# Patient Record
Sex: Female | Born: 1980 | Hispanic: Yes | Marital: Married | Smoking: Never smoker
Health system: Southern US, Community
[De-identification: ages and names within clinical notes are randomized; demographics above are authoritative.]

## PROBLEM LIST (undated history)

## (undated) DIAGNOSIS — J45909 Unspecified asthma, uncomplicated: Secondary | ICD-10-CM

## (undated) HISTORY — PX: CHOLECYSTECTOMY: SHX55

## (undated) HISTORY — DX: Unspecified asthma, uncomplicated: J45.909

---

## 2006-01-20 ENCOUNTER — Ambulatory Visit: Payer: Self-pay | Admitting: Family

## 2006-06-06 ENCOUNTER — Emergency Department: Payer: Self-pay | Admitting: Emergency Medicine

## 2013-06-03 ENCOUNTER — Emergency Department: Payer: Self-pay | Admitting: Emergency Medicine

## 2013-06-03 LAB — BASIC METABOLIC PANEL
ANION GAP: 4 — AB (ref 7–16)
BUN: 9 mg/dL (ref 7–18)
CHLORIDE: 106 mmol/L (ref 98–107)
CO2: 29 mmol/L (ref 21–32)
Calcium, Total: 9.2 mg/dL (ref 8.5–10.1)
Creatinine: 0.6 mg/dL (ref 0.60–1.30)
EGFR (African American): >60
EGFR (Non-African Amer.): >60
Glucose: 95 mg/dL (ref 65–99)
Osmolality: 276 (ref 275–301)
Potassium: 4.1 mmol/L (ref 3.5–5.1)
SODIUM: 139 mmol/L (ref 136–145)

## 2013-06-03 LAB — CBC
HCT: 43.3 % (ref 35.0–47.0)
HGB: 14.3 g/dL (ref 12.0–16.0)
MCH: 28.4 pg (ref 26.0–34.0)
MCHC: 32.9 g/dL (ref 32.0–36.0)
MCV: 86 fL (ref 80–100)
Platelet: 184 10*3/uL (ref 150–440)
RBC: 5.02 10*6/uL (ref 3.80–5.20)
RDW: 13.5 % (ref 11.5–14.5)
WBC: 8.8 10*3/uL (ref 3.6–11.0)

## 2013-06-03 LAB — TROPONIN I: Troponin-I: <0.02 ng/mL

## 2014-03-03 ENCOUNTER — Ambulatory Visit: Payer: Self-pay | Admitting: Family Medicine

## 2021-02-26 ENCOUNTER — Emergency Department: Payer: Self-pay

## 2021-02-26 ENCOUNTER — Emergency Department
Admission: EM | Admit: 2021-02-26 | Discharge: 2021-02-26 | Disposition: A | Discharge status: Home / Self Care | Payer: Self-pay | Attending: Emergency Medicine | Admitting: Emergency Medicine

## 2021-02-26 ENCOUNTER — Other Ambulatory Visit: Payer: Self-pay

## 2021-02-26 ENCOUNTER — Encounter: Payer: Self-pay | Admitting: Emergency Medicine

## 2021-02-26 DIAGNOSIS — Z3A01 Less than 8 weeks gestation of pregnancy: Secondary | ICD-10-CM | POA: Insufficient documentation

## 2021-02-26 DIAGNOSIS — O209 Hemorrhage in early pregnancy, unspecified: Secondary | ICD-10-CM

## 2021-02-26 DIAGNOSIS — Z3491 Encounter for supervision of normal pregnancy, unspecified, first trimester: Secondary | ICD-10-CM

## 2021-02-26 DIAGNOSIS — O200 Threatened abortion: Secondary | ICD-10-CM | POA: Insufficient documentation

## 2021-02-26 DIAGNOSIS — R102 Pelvic and perineal pain: Secondary | ICD-10-CM | POA: Insufficient documentation

## 2021-02-26 LAB — CBC WITH DIFFERENTIAL/PLATELET
Abs Immature Granulocytes: 0.07 10*3/uL (ref 0.00–0.07)
Basophils Absolute: 0.1 10*3/uL (ref 0.0–0.1)
Basophils Relative: 1 %
Eosinophils Absolute: 0.8 10*3/uL — ABNORMAL HIGH (ref 0.0–0.5)
Eosinophils Relative: 6 %
HCT: 41.5 % (ref 36.0–46.0)
Hemoglobin: 14 g/dL (ref 12.0–15.0)
Immature Granulocytes: 1 %
Lymphocytes Relative: 18 %
Lymphs Abs: 2.2 10*3/uL (ref 0.7–4.0)
MCH: 28.2 pg (ref 26.0–34.0)
MCHC: 33.7 g/dL (ref 30.0–36.0)
MCV: 83.7 fL (ref 80.0–100.0)
Monocytes Absolute: 1 10*3/uL (ref 0.1–1.0)
Monocytes Relative: 8 %
Neutro Abs: 8.1 10*3/uL — ABNORMAL HIGH (ref 1.7–7.7)
Neutrophils Relative %: 66 %
Platelets: 189 10*3/uL (ref 150–400)
RBC: 4.96 MIL/uL (ref 3.87–5.11)
RDW: 13.5 % (ref 11.5–15.5)
WBC: 12.2 10*3/uL — ABNORMAL HIGH (ref 4.0–10.5)
nRBC: 0 % (ref 0.0–0.2)

## 2021-02-26 LAB — BASIC METABOLIC PANEL
Anion gap: 7 (ref 5–15)
BUN: 8 mg/dL (ref 6–20)
CO2: 23 mmol/L (ref 22–32)
Calcium: 9.2 mg/dL (ref 8.9–10.3)
Chloride: 105 mmol/L (ref 98–111)
Creatinine, Ser: 0.42 mg/dL — ABNORMAL LOW (ref 0.44–1.00)
GFR, Estimated: >60 mL/min (ref >60)
Glucose, Bld: 120 mg/dL — ABNORMAL HIGH (ref 70–99)
Potassium: 3.5 mmol/L (ref 3.5–5.1)
Sodium: 135 mmol/L (ref 135–145)

## 2021-02-26 LAB — HCG, QUANTITATIVE, PREGNANCY: hCG, Beta Chain, Quant, S: 2809 m[IU]/mL — ABNORMAL HIGH (ref <5)

## 2021-02-26 LAB — POC URINE PREG, ED: Preg Test, Ur: POSITIVE — AB

## 2021-02-26 NOTE — Discharge Instructions (Addendum)
Results for orders placed or performed during the hospital encounter of 02/26/21  CBC with Differential  Result Value Ref Range   WBC 12.2 (H) 4.0 - 10.5 K/uL   RBC 4.96 3.87 - 5.11 MIL/uL   Hemoglobin 14.0 12.0 - 15.0 g/dL   HCT 00.1 74.9 - 44.9 %   MCV 83.7 80.0 - 100.0 fL   MCH 28.2 26.0 - 34.0 pg   MCHC 33.7 30.0 - 36.0 g/dL   RDW 67.5 91.6 - 38.4 %   Platelets 189 150 - 400 K/uL   nRBC 0.0 0.0 - 0.2 %   Neutrophils Relative % 66 %   Neutro Abs 8.1 (H) 1.7 - 7.7 K/uL   Lymphocytes Relative 18 %   Lymphs Abs 2.2 0.7 - 4.0 K/uL   Monocytes Relative 8 %   Monocytes Absolute 1.0 0.1 - 1.0 K/uL   Eosinophils Relative 6 %   Eosinophils Absolute 0.8 (H) 0.0 - 0.5 K/uL   Basophils Relative 1 %   Basophils Absolute 0.1 0.0 - 0.1 K/uL   Immature Granulocytes 1 %   Abs Immature Granulocytes 0.07 0.00 - 0.07 K/uL  Basic metabolic panel  Result Value Ref Range   Sodium 135 135 - 145 mmol/L   Potassium 3.5 3.5 - 5.1 mmol/L   Chloride 105 98 - 111 mmol/L   CO2 23 22 - 32 mmol/L   Glucose, Bld 120 (H) 70 - 99 mg/dL   BUN 8 6 - 20 mg/dL   Creatinine, Ser 6.65 (L) 0.44 - 1.00 mg/dL   Calcium 9.2 8.9 - 99.3 mg/dL   GFR, Estimated >57 >01 mL/min   Anion gap 7 5 - 15  POC urine preg, ED  Result Value Ref Range   Preg Test, Ur POSITIVE (A) NEGATIVE   US OB LESS THAN 14 WEEKS WITH OB TRANSVAGINAL  Result Date: 02/26/2021 CLINICAL DATA:  Vaginal bleeding EXAM: OBSTETRIC <14 WK [place] AND TRANSVAGINAL OB US TECHNIQUE: Both transabdominal and transvaginal ultrasound examinations were performed for complete evaluation of the gestation as well as the maternal uterus, adnexal regions, and pelvic cul-de-sac. Transvaginal technique was performed to assess early pregnancy. COMPARISON:  None. FINDINGS: Intrauterine gestational sac: Single intrauterine gestational sac Yolk sac:  Not definitively visualized. Embryo:  Not visualized MSD: 17 mm   6 w   4 d Subchorionic hemorrhage:  Small subchorionic  hemorrhage Maternal uterus/adnexae: Ovaries are within normal limits. The right ovary measures 3 x 1.9 by 1.8 cm. The left ovary measures 3.3 x 1.8 x 1.8 cm. Small free fluid IMPRESSION: 1. Single slightly irregular intrauterine gestational sac with mean sac diameter of 17 mm and no embryo. Findings are suspicious but not yet definitive for failed pregnancy. Recommend follow-up US in 10-14 days for definitive diagnosis. This recommendation follows SRU consensus guidelines: Diagnostic Criteria for Nonviable Pregnancy Early in the First Trimester. Malva Limes Med 2013; 779:3903-00. 2. Small subchorionic hemorrhage Electronically Signed   By: Jasmine Pang M.D.   On: 02/26/2021 22:03

## 2021-02-26 NOTE — ED Triage Notes (Signed)
Pt here with c/o vaginal bleeding for 3 days now, lmp start date 02/24/21, states painful lower abd cramping, severe lower back pain, heavy bleeding with clots, denies known pregnancy test. Uses depo shot for birth control. NAD.

## 2021-02-26 NOTE — ED Provider Notes (Signed)
[facility] Emergency Department Provider Note  ____________________________________________  Time seen: Approximately 11:05 PM  I have reviewed the triage vital signs and the nursing notes.   HISTORY  Chief Complaint Vaginal Bleeding  Encounter completed with Spanish video interpreter at bedside  HPI Barbara Blevins is a 40 y.o. female with past history of asthma who comes the ED complaining of vaginal bleeding for the past 2 days associated with cramping pelvic pain radiating to bilateral hips.  Bleeding is intermittent, mild.  No dysuria or abnormal vaginal discharge.  No fever, no chest pain or shortness of breath.  Patient reports having a negative pregnancy test in October at her PCP prior to having a Depo-Provera injection for contraception.    History reviewed. No pertinent past medical history.   There are no problems to display for this patient.    History reviewed. No pertinent surgical history.   Prior to Admission medications   Not on File     Allergies Patient has no known allergies.   No family history on file.  Social History Social History   Tobacco Use   Smoking status: Never   Smokeless tobacco: Never  Substance Use Topics   Alcohol use: Not Currently   Drug use: Never    Review of Systems  Constitutional:   No fever or chills.  ENT:   No sore throat. No rhinorrhea. Cardiovascular:   No chest pain or syncope. Respiratory:   No dyspnea or cough. Gastrointestinal:   Positive pelvic pain as above.  Musculoskeletal:   Negative for focal pain or swelling All other systems reviewed and are negative except as documented above in ROS and HPI.  ____________________________________________   PHYSICAL EXAM:  VITAL SIGNS: ED Triage Vitals  Enc Vitals Group     BP 02/26/21 2217 137/76     Pulse Rate 02/26/21 2217 80     Resp 02/26/21 2217 18     Temp 02/26/21 1614 98.4 F (36.9 C)     Temp Source 02/26/21 1614  Oral     SpO2 02/26/21 2217 100 %     Weight 02/26/21 1615 175 lb (79.4 kg)     Height 02/26/21 1615 5' (1.524 m)     Head Circumference --      Peak Flow --      Pain Score 02/26/21 1615 10     Pain Loc --      Pain Edu? --      Excl. in GC? --     Vital signs reviewed, nursing assessments reviewed.   Constitutional:   Alert and oriented. Non-toxic appearance. Eyes:   Conjunctivae are normal. EOMI. PERRL. ENT      Head:   Normocephalic and atraumatic.      Nose:   Wearing a mask.      Mouth/Throat:   Wearing a mask.      Neck:   No meningismus. Full ROM. Hematological/Lymphatic/Immunilogical:   No cervical lymphadenopathy. Cardiovascular:   RRR. Symmetric bilateral radial and DP pulses.  No murmurs. Cap refill less than 2 seconds. Respiratory:   Normal respiratory effort without tachypnea/retractions. Breath sounds are clear and equal bilaterally. No wheezes/rales/rhonchi. Gastrointestinal:   Soft and nontender. Non distended. There is no CVA tenderness.  No rebound, rigidity, or guarding. Genitourinary:   deferred Musculoskeletal:   Normal range of motion in all extremities. No joint effusions.  No lower extremity tenderness.  No edema. Neurologic:   Normal speech and language.  Motor grossly intact. No  acute focal neurologic deficits are appreciated.  Skin:    Skin is warm, dry and intact. No rash noted.  No petechiae, purpura, or bullae.  ____________________________________________    LABS (pertinent positives/negatives) (all labs ordered are listed, but only abnormal results are displayed) Labs Reviewed  CBC WITH DIFFERENTIAL/PLATELET - Abnormal; Notable for the following components:      Result Value   WBC 12.2 (*)    Neutro Abs 8.1 (*)    Eosinophils Absolute 0.8 (*)    All other components within normal limits  BASIC METABOLIC PANEL - Abnormal; Notable for the following components:   Glucose, Bld 120 (*)    Creatinine, Ser 0.42 (*)    All other components  within normal limits  POC URINE PREG, ED - Abnormal; Notable for the following components:   Preg Test, Ur POSITIVE (*)    All other components within normal limits  HCG, QUANTITATIVE, PREGNANCY  POC URINE PREG, ED  ABO/RH   ____________________________________________   EKG    ____________________________________________    RADIOLOGY  US OB LESS THAN 14 WEEKS WITH OB TRANSVAGINAL  Result Date: 02/26/2021 CLINICAL DATA:  Vaginal bleeding EXAM: OBSTETRIC <14 WK [place] AND TRANSVAGINAL OB US TECHNIQUE: Both transabdominal and transvaginal ultrasound examinations were performed for complete evaluation of the gestation as well as the maternal uterus, adnexal regions, and pelvic cul-de-sac. Transvaginal technique was performed to assess early pregnancy. COMPARISON:  None. FINDINGS: Intrauterine gestational sac: Single intrauterine gestational sac Yolk sac:  Not definitively visualized. Embryo:  Not visualized MSD: 17 mm   6 w   4 d Subchorionic hemorrhage:  Small subchorionic hemorrhage Maternal uterus/adnexae: Ovaries are within normal limits. The right ovary measures 3 x 1.9 by 1.8 cm. The left ovary measures 3.3 x 1.8 x 1.8 cm. Small free fluid IMPRESSION: 1. Single slightly irregular intrauterine gestational sac with mean sac diameter of 17 mm and no embryo. Findings are suspicious but not yet definitive for failed pregnancy. Recommend follow-up US in 10-14 days for definitive diagnosis. This recommendation follows SRU consensus guidelines: Diagnostic Criteria for Nonviable Pregnancy Early in the First Trimester. Malva Limes Med 2013; 595:6387-56. 2. Small subchorionic hemorrhage Electronically Signed   By: Jasmine Pang M.D.   On: 02/26/2021 22:03    ____________________________________________   PROCEDURES Procedures  ____________________________________________  DIFFERENTIAL DIAGNOSIS   Threatened Miscarriage, ectopic pregnancy  CLINICAL IMPRESSION / ASSESSMENT AND PLAN / ED  COURSE  Medications ordered in the ED: Medications - No data to display  Pertinent labs & imaging results that were available during my care of the patient were reviewed by me and considered in my medical decision making (see chart for details).  Melrose Arbuthnot was evaluated in Emergency Department on 02/26/2021 for the symptoms described in the history of present illness. She was evaluated in the context of the global COVID-19 pandemic, which necessitated consideration that the patient might be at risk for infection with the SARS-CoV-2 virus that causes COVID-19. Institutional protocols and algorithms that pertain to the evaluation of patients at risk for COVID-19 are in a state of rapid change based on information released by regulatory bodies including the CDC and federal and state organizations. These policies and algorithms were followed during the patient's care in the ED.   Patient presents with vaginal bleeding, positive pregnancy test.  Prior records show blood type is O+.  Pelvic ultrasound shows intrauterine gestational sac without embryo, suggestive but not definitive for failed pregnancy.  Counseled patient on threatened miscarriage, need  to follow-up with OB in 3 days for reassessment.  Return precautions discussed.  Serum labs unremarkable.  Beta hCG pending.      ____________________________________________   FINAL CLINICAL IMPRESSION(S) / ED DIAGNOSES    Final diagnoses:  Threatened miscarriage  First trimester pregnancy     ED Discharge Orders     None       Portions of this note were generated with dragon dictation software. Dictation errors may occur despite best attempts at proofreading.    Sharman Cheek, MD 02/26/21 [id number]

## 2021-02-27 LAB — ABO/RH: ABO/RH(D): O POS

## 2022-12-22 ENCOUNTER — Encounter: Payer: Self-pay | Admitting: Family Medicine

## 2022-12-29 ENCOUNTER — Other Ambulatory Visit: Payer: Self-pay

## 2022-12-29 DIAGNOSIS — Z1231 Encounter for screening mammogram for malignant neoplasm of breast: Secondary | ICD-10-CM

## 2023-01-02 ENCOUNTER — Ambulatory Visit: Payer: Self-pay | Attending: Hematology and Oncology | Admitting: Hematology and Oncology

## 2023-01-02 ENCOUNTER — Ambulatory Visit
Admission: RE | Admit: 2023-01-02 | Discharge: 2023-01-02 | Disposition: A | Discharge status: Home / Self Care | Payer: Self-pay | Source: Ambulatory Visit | Attending: Obstetrics and Gynecology | Admitting: Obstetrics and Gynecology

## 2023-01-02 VITALS — BP 129/86 | Wt 176.0 lb

## 2023-01-02 DIAGNOSIS — Z1231 Encounter for screening mammogram for malignant neoplasm of breast: Secondary | ICD-10-CM

## 2023-01-02 NOTE — Progress Notes (Signed)
[name] is a 42 y.o. female who presents to [facility] clinic today with no complaints.    Pap Smear: Pap not smear completed today. Last Pap smear was 10/31/2019 at [facility] and was normal. Per patient has no history of an abnormal Pap smear. Last Pap smear result is available in Epic.   Physical exam: Breasts Breasts symmetrical. No skin abnormalities bilateral breasts. No nipple retraction bilateral breasts. No nipple discharge bilateral breasts. No lymphadenopathy. No lumps palpated bilateral breasts.       Pelvic/Bimanual Pap is not indicated today    Smoking History: Patient has never smoked and was not referred to quit line.    Patient Navigation: Patient education provided. Access to services provided for patient through BCCCP program. Barbara Blevins interpreter provided. No transportation provided   Colorectal Cancer Screening: Per patient has never had colonoscopy completed No complaints today.    Breast and Cervical Cancer Risk Assessment: Patient does not have family history of breast cancer, known genetic mutations, or radiation treatment to the chest before age 34. Patient does not have history of cervical dysplasia, immunocompromised, or DES exposure in-utero.  Risk Scores as of Encounter on 01/02/2023     Barbara Blevins           5-year 0.56%   Lifetime 10.16%   This patient is Hisp[place]/Latina but has no documented birth country, so the [place] model used data from [place] patients to calculate their risk score. Document a birth country in the Demographics activity for a more accurate score.         Last calculated by Barbara Rutherford, LPN on 16/03/958 at  9:40 AM        A: BCCCP exam without pap smear No complaints with benign exam.   P: Referred patient to the [facility] for a screening mammogram. Appointment scheduled 01/02/2023.  Pascal Lux, NP 01/02/2023 9:51 AM

## 2023-01-02 NOTE — Patient Instructions (Addendum)
Taught Barbara Blevins about self breast awareness and gave educational materials to take home. Patient did not need a Pap smear today due to last Pap smear was in 10/31/2019 per patient. Let her know BCCCP will cover Pap smears every 5 years unless has a history of abnormal Pap smears. Referred patient to the [facility] for screening mammogram. Appointment scheduled for 01/02/2023. Patient aware of appointment and will be there. Let patient know will follow up with her within the next couple weeks with results. Barbara Blevins verbalized understanding.  Pascal Lux, NP 9:22 AM

## 2023-01-04 ENCOUNTER — Other Ambulatory Visit: Payer: Self-pay

## 2023-01-04 DIAGNOSIS — R928 Other abnormal and inconclusive findings on diagnostic imaging of breast: Secondary | ICD-10-CM

## 2023-01-17 ENCOUNTER — Other Ambulatory Visit: Payer: Self-pay | Admitting: Obstetrics and Gynecology

## 2023-01-17 ENCOUNTER — Ambulatory Visit
Admission: RE | Admit: 2023-01-17 | Discharge: 2023-01-17 | Disposition: A | Discharge status: Home / Self Care | Payer: Self-pay | Source: Ambulatory Visit | Attending: Obstetrics and Gynecology | Admitting: Obstetrics and Gynecology

## 2023-01-17 DIAGNOSIS — R928 Other abnormal and inconclusive findings on diagnostic imaging of breast: Secondary | ICD-10-CM | POA: Insufficient documentation

## 2023-03-02 ENCOUNTER — Other Ambulatory Visit: Payer: Self-pay

## 2023-03-02 DIAGNOSIS — R928 Other abnormal and inconclusive findings on diagnostic imaging of breast: Secondary | ICD-10-CM

## 2023-05-08 IMAGING — US US OB < 14 WEEKS - US OB TV
1 series · 13 of 28 positions shown · non-contrast
Comparison: None.

CLINICAL DATA: Vaginal bleeding

EXAM:
OBSTETRIC <14 WK US AND TRANSVAGINAL OB US
TECHNIQUE: Both transabdominal and transvaginal ultrasound examinations were
performed for complete evaluation of the gestation as well as the
maternal uterus, adnexal regions, and pelvic cul-de-sac.
Transvaginal technique was performed to assess early pregnancy.

[Series 1: us ob less than 14 weeks with ob transvaginal · 78 acquisitions, 13 frames shown]
[im 3/78]
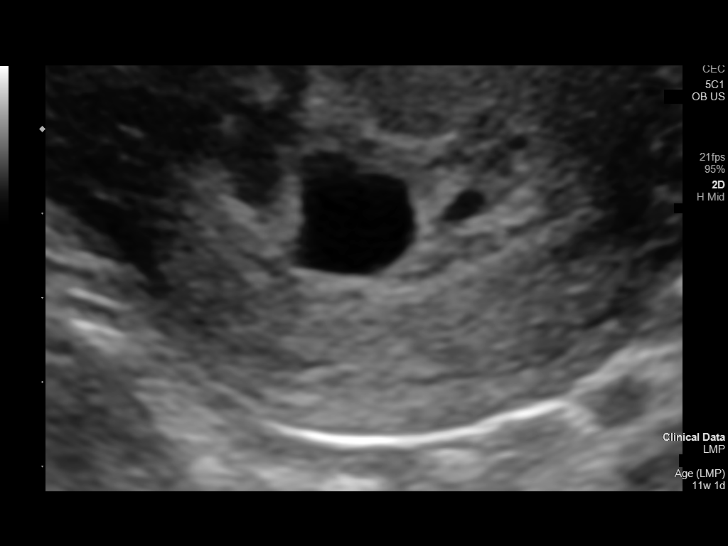
[im 9/78]
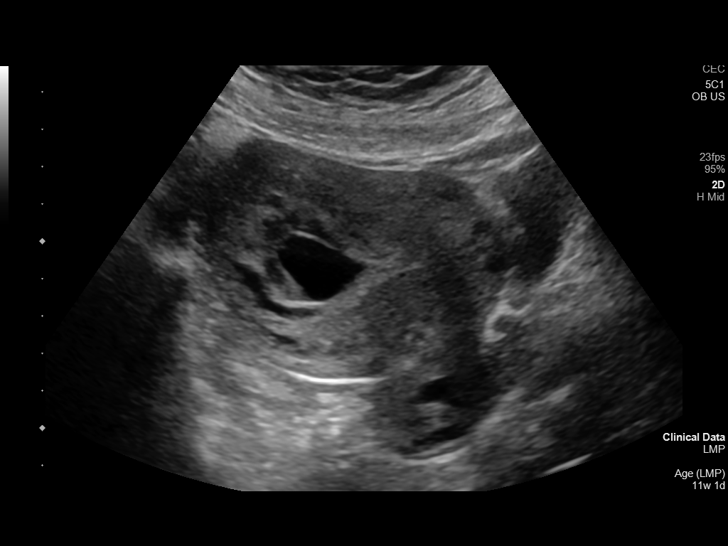
[im 15/78]
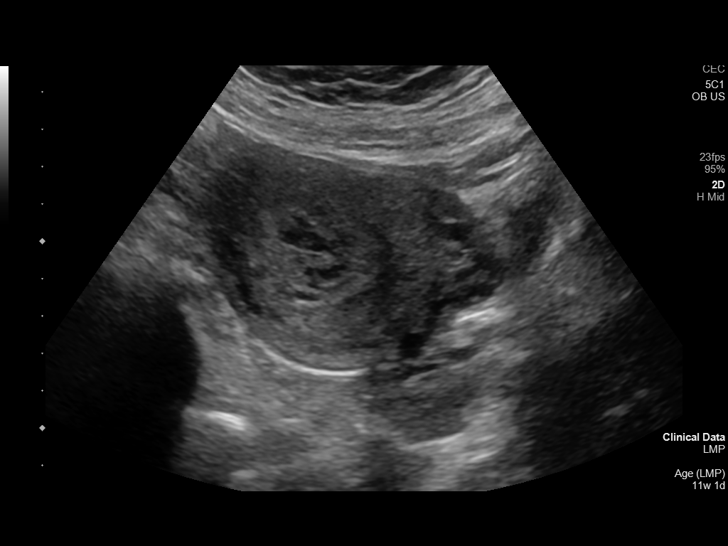
[im 20/78]
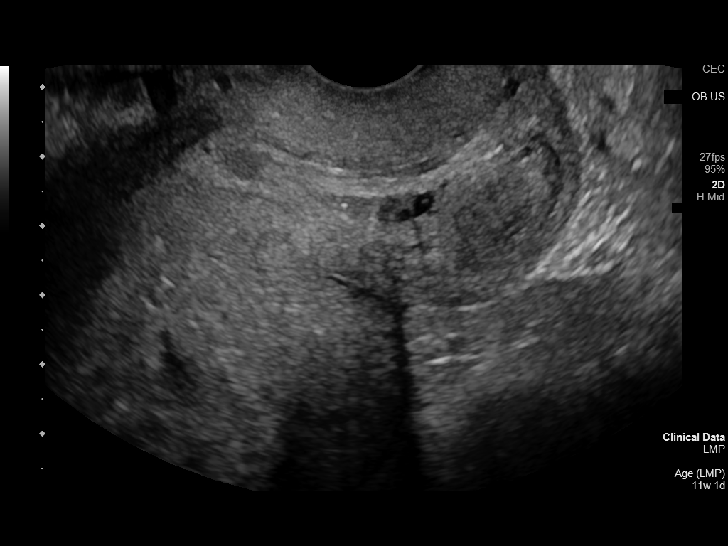
[im 26/78]
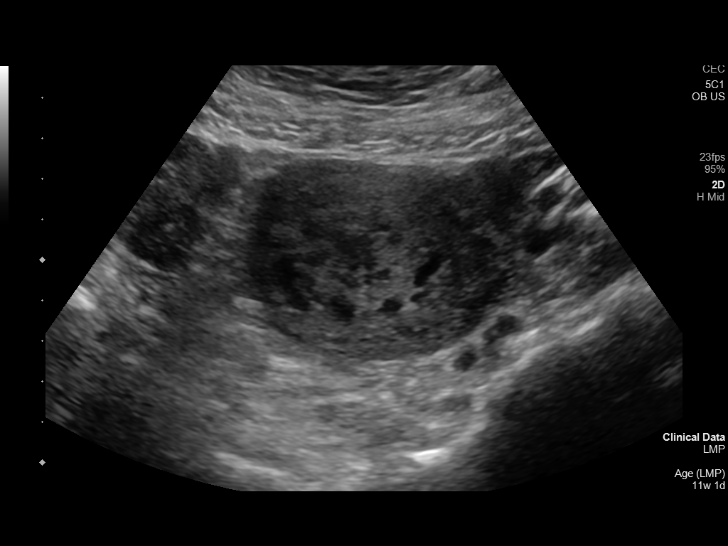
[im 32/78]
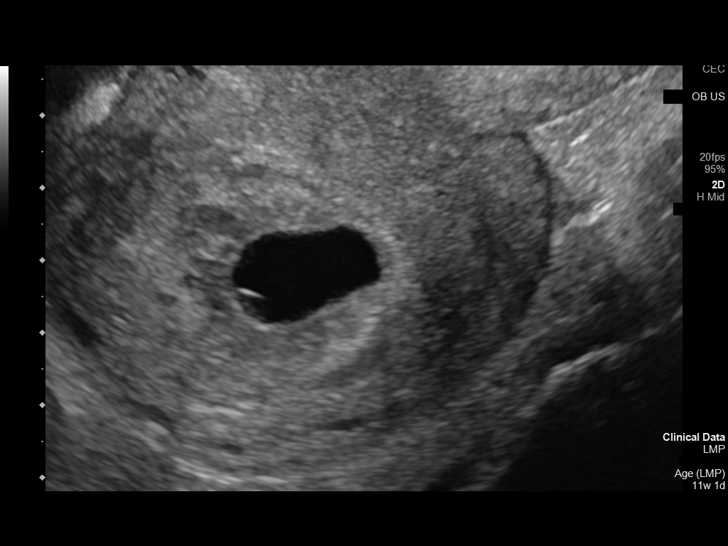
[im 40/78]
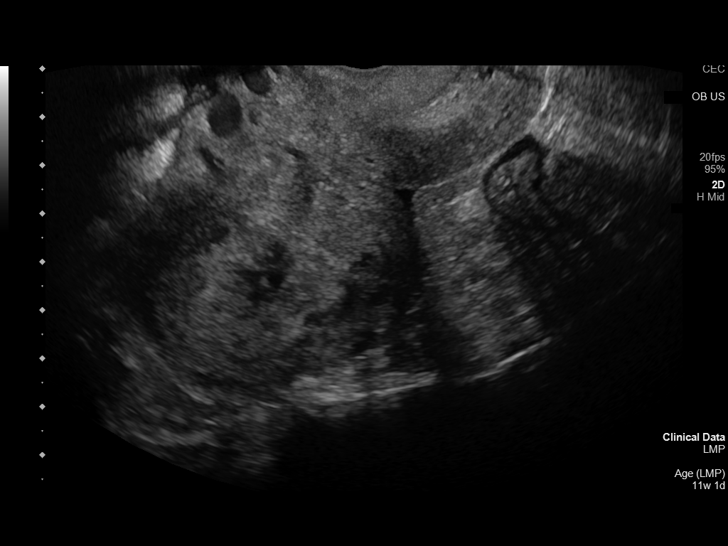
[im 46/78]
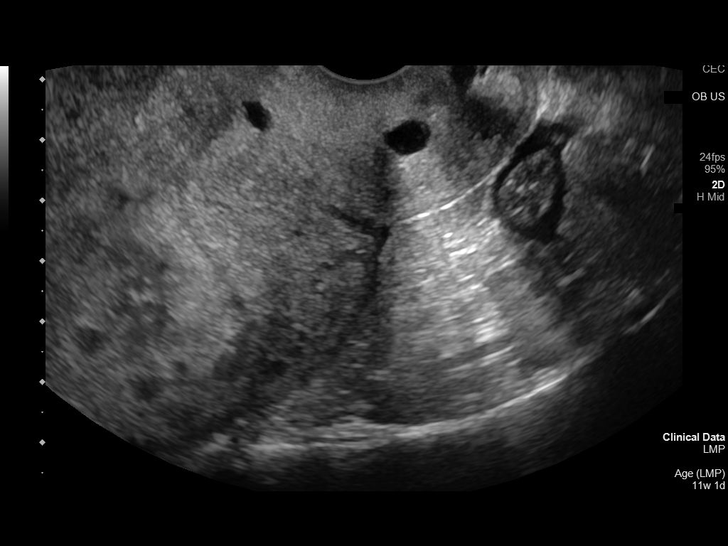
[im 52/78]
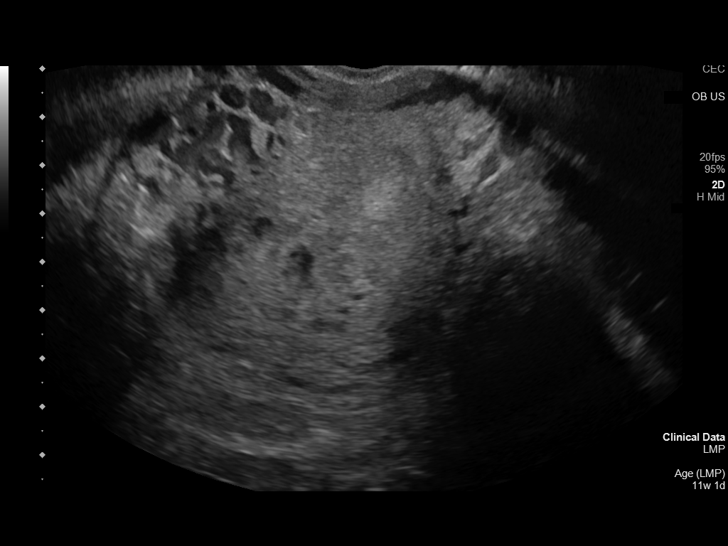
[im 58/78]
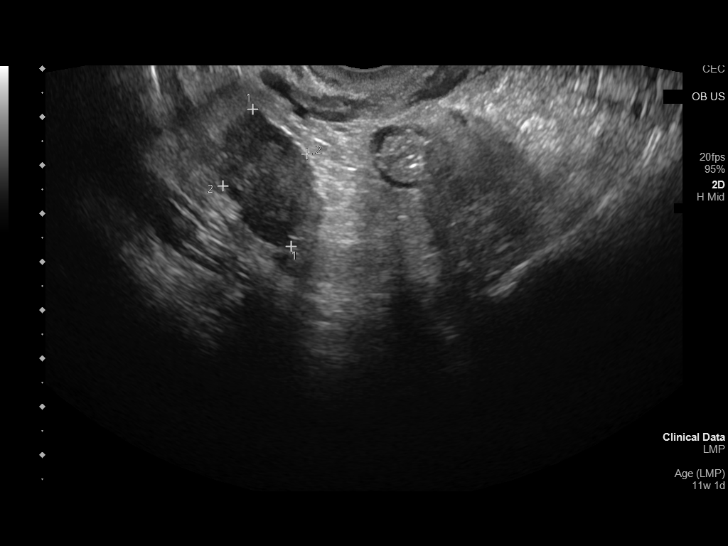
[im 63/78]
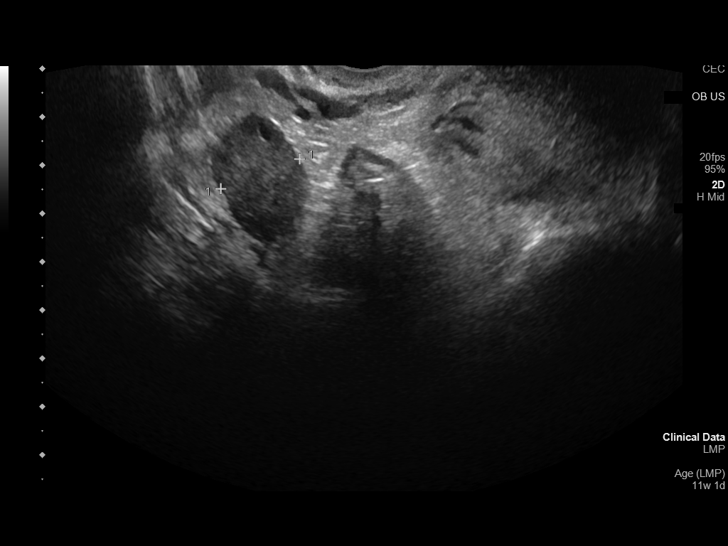
[im 69/78]
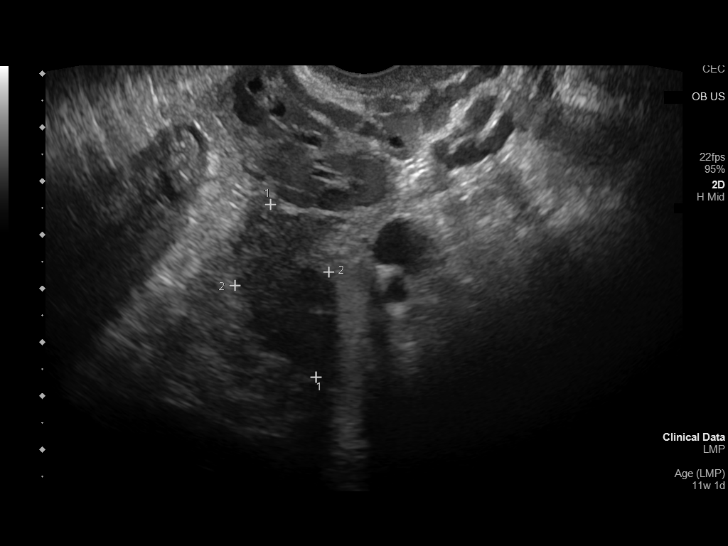
[im 75/78]
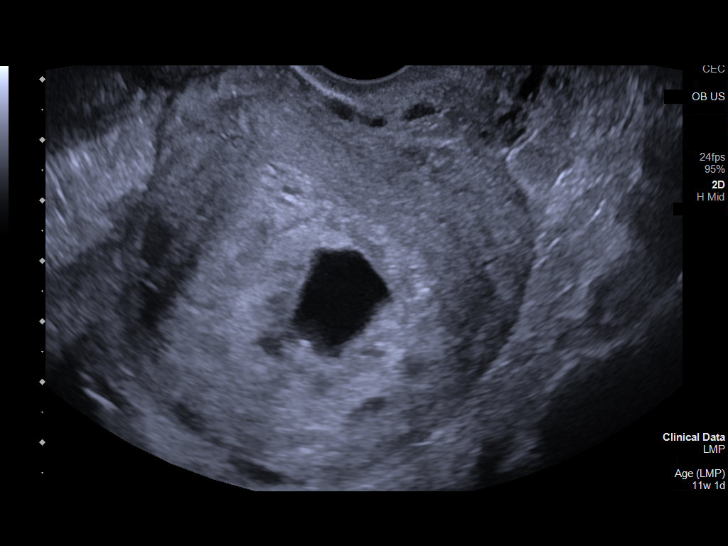

[13 of 28 positions shown; findings below may reference images not displayed]

FINDINGS: Intrauterine gestational sac: Single intrauterine gestational sac

Yolk sac:  Not definitively visualized.

Embryo:  Not visualized

MSD: 17 mm   6 w   4 d

Subchorionic hemorrhage:  Small subchorionic hemorrhage

Maternal uterus/adnexae: Ovaries are within normal limits. The right
ovary measures 3 x 1.9 by 1.8 cm. The left ovary measures 3.3 x
x 1.8 cm. Small free fluid
IMPRESSION: 1. Single slightly irregular intrauterine gestational sac with mean
sac diameter of 17 mm and no embryo. Findings are suspicious but not
yet definitive for failed pregnancy. Recommend follow-up US in 10-14
days for definitive diagnosis. This recommendation follows SRU
consensus guidelines: Diagnostic Criteria for Nonviable Pregnancy
Early in the First Trimester. N Engl J Med 0528; [DATE].
2. Small subchorionic hemorrhage

## 2023-07-19 ENCOUNTER — Ambulatory Visit
Admission: RE | Admit: 2023-07-19 | Discharge: 2023-07-19 | Disposition: A | Discharge status: Home / Self Care | Payer: Self-pay | Source: Ambulatory Visit | Attending: Obstetrics and Gynecology | Admitting: Obstetrics and Gynecology

## 2023-07-19 ENCOUNTER — Encounter: Payer: Self-pay | Admitting: Radiology

## 2023-07-19 DIAGNOSIS — R928 Other abnormal and inconclusive findings on diagnostic imaging of breast: Secondary | ICD-10-CM | POA: Insufficient documentation

## 2023-07-20 ENCOUNTER — Other Ambulatory Visit: Payer: Self-pay | Admitting: Obstetrics and Gynecology

## 2023-07-20 DIAGNOSIS — R928 Other abnormal and inconclusive findings on diagnostic imaging of breast: Secondary | ICD-10-CM

## 2023-10-16 ENCOUNTER — Other Ambulatory Visit: Payer: Self-pay

## 2023-10-16 ENCOUNTER — Other Ambulatory Visit: Payer: Self-pay | Admitting: Obstetrics and Gynecology

## 2023-10-16 DIAGNOSIS — R928 Other abnormal and inconclusive findings on diagnostic imaging of breast: Secondary | ICD-10-CM

## 2024-01-02 ENCOUNTER — Ambulatory Visit: Payer: Self-pay

## 2024-01-23 ENCOUNTER — Other Ambulatory Visit: Payer: Self-pay

## 2024-01-29 NOTE — Progress Notes (Unsigned)
 [name] is a 43 y.o. female who presents to [facility] clinic today with no complaints. Patient had a right breast diagnostic mammogram completed 07/19/2023 that was probably benign that a bilateral diagnostic mammogram is recommended in one year.   Pap Smear: Pap smear not completed today. Last Pap smear was 10/31/2019 at [facility] clinic and was normal with negative HPV. Per patient has no history of an abnormal Pap smear. Last Pap smear result is available in Epic.   Physical exam: Breasts Breasts symmetrical. No skin abnormalities bilateral breasts. No nipple retraction bilateral breasts. No nipple discharge bilateral breasts. No lymphadenopathy. No lumps palpated bilateral breasts. No complaints of pain or tenderness on exam.     MS 3D DIAG MAMMO UNI RT BR (aka MM) Result Date: 07/19/2023 CLINICAL DATA:  BI-RADS 3 follow-up of a RIGHT breast asymmetry, initiated October 2024. EXAM: DIGITAL DIAGNOSTIC UNILATERAL RIGHT MAMMOGRAM WITH TOMOSYNTHESIS AND CAD TECHNIQUE: Right digital diagnostic mammography and breast tomosynthesis was performed. The images were evaluated with computer-aided detection. COMPARISON:  Previous exam(s). ACR Breast Density Category c: The breasts are heterogeneously dense, which may obscure small masses. FINDINGS: Diagnostic images of the RIGHT breast demonstrate mammographic stability of a RIGHT breast asymmetry seen on MLO view only in the upper breast at posterior depth. No new suspicious findings are noted. IMPRESSION: Stable probably benign RIGHT breast asymmetry, favored to reflect patient's baseline configuration of fibroglandular tissue. Recommend follow-up diagnostic mammogram in 6 months. This will establish 1 year definitive stability from baseline mammogram. RECOMMENDATION: Recommend bilateral diagnostic mammogram (with RIGHT and LEFT breast ultrasound if deemed necessary) in 6 months. Patient is due for contralateral screening at this point in time. I  have discussed the findings and recommendations with the patient with the assistance of a Spanish interpreter. If applicable, a reminder letter will be sent to the patient regarding the next appointment. BI-RADS CATEGORY  3: Probably benign. Electronically Signed   By: Corean Salter M.D.   On: 07/19/2023 10:54   MS 3D DIAG MAMMO UNI RT BR (aka MM) Result Date: 01/17/2023 CLINICAL DATA:  RIGHT asymmetry callback, baseline EXAM: DIGITAL DIAGNOSTIC UNILATERAL RIGHT MAMMOGRAM WITH TOMOSYNTHESIS AND CAD; ULTRASOUND RIGHT BREAST LIMITED TECHNIQUE: Right digital diagnostic mammography and breast tomosynthesis was performed. The images were evaluated with computer-aided detection. ; Targeted ultrasound examination of the right breast was performed COMPARISON:  Previous exam(s). ACR Breast Density Category c: The breasts are heterogeneously dense, which may obscure small masses. FINDINGS: Spot compression tomosynthesis views demonstrates partial effacement an asymmetry in the RIGHT upper breast at posterior depth. There is a persistent asymmetry noted on true lateral slice 28. A definitive correlate is not identified on repeat CC to visualize more posterior tissue. Targeted ultrasound was performed of the RIGHT upper breast. No suspicious cystic or solid mass is seen at the site of screening mammographic concern. IMPRESSION: 1. There is a probably benign asymmetry at the site of screening mammographic concern in the upper breast on ML and MLO view only. This likely reflects patient's baseline configuration of fibroglandular tissue. Recommend follow-up diagnostic mammogram in 6 months. This will establish 6 months of definitive stability from baseline. RECOMMENDATION: RIGHT diagnostic mammogram (with RIGHT breast ultrasound if deemed necessary) in 6 months. I have discussed the findings and recommendations with the patient with the assistance of a Spanish interpreter. If applicable, a reminder letter will be sent to  the patient regarding the next appointment. BI-RADS CATEGORY  3: Probably benign. Electronically Signed   By:  Corean Salter M.D.   On: 01/17/2023 10:59   MS 3D SCR MAMMO BILAT BR (aka MM) Result Date: 01/04/2023 CLINICAL DATA:  Screening. EXAM: DIGITAL SCREENING BILATERAL MAMMOGRAM WITH TOMOSYNTHESIS AND CAD TECHNIQUE: Bilateral screening digital craniocaudal and mediolateral oblique mammograms were obtained. Bilateral screening digital breast tomosynthesis was performed. The images were evaluated with computer-aided detection. COMPARISON:  None ACR Breast Density Category c: The breasts are heterogeneously dense, which may obscure small masses. FINDINGS: In the right breast, a possible asymmetry warrants further evaluation. In the left breast, no findings suspicious for malignancy. IMPRESSION: Further evaluation is suggested for possible asymmetry in the right breast. RECOMMENDATION: Diagnostic mammogram and possibly ultrasound of the right breast. (Code:[id number]) The patient will be contacted regarding the findings, and additional imaging will be scheduled. BI-RADS CATEGORY  0: Incomplete: Need additional imaging evaluation. Electronically Signed   By: Toribio Agreste M.D.   On: 01/04/2023 10:17    Pelvic/Bimanual Pap is not indicated today per BCCCP guidelines.   Smoking History: Patient has never smoked.   Patient Navigation: Patient education provided. Access to services provided for patient through [facility] program. Spanish interpreter Alan Been from [facility] provided.   Breast and Cervical Cancer Risk Assessment: Patient does not have family history of breast cancer, known genetic mutations, or radiation treatment to the chest before age 63. Patient does not have history of cervical dysplasia, immunocompromised, or DES exposure in-utero.  Risk Scores as of Encounter on 01/30/2024     [place] as of 01/02/2023           5-year 0.56%   Lifetime 10.16%   This patient is  Hisp[place]/Latina but has no documented birth country, so the [place] model used data from [place] patients to calculate their risk score. Document a birth country in the Demographics activity for a more accurate score.         Last calculated by Rogerio Tempie SQUIBB, LPN on 89/04/7973 at  9:40 AM        A: BCCCP exam with pap smear No complaints.  P: Referred patient to the [facility] for a diagnostic mammogram per recommendation. Appointment scheduled Tuesday, January 30, 2024 at 1340.  Driscilla Wanda SQUIBB, RN 01/29/2024 9:03 PM

## 2024-01-30 ENCOUNTER — Ambulatory Visit
Admission: RE | Admit: 2024-01-30 | Discharge: 2024-01-30 | Disposition: A | Discharge status: Home / Self Care | Payer: Self-pay | Source: Ambulatory Visit | Attending: Obstetrics and Gynecology | Admitting: Obstetrics and Gynecology

## 2024-01-30 ENCOUNTER — Ambulatory Visit: Payer: Self-pay | Attending: Obstetrics and Gynecology | Admitting: *Deleted

## 2024-01-30 VITALS — BP 153/91 | Wt 180.0 lb

## 2024-01-30 DIAGNOSIS — R928 Other abnormal and inconclusive findings on diagnostic imaging of breast: Secondary | ICD-10-CM

## 2024-01-30 DIAGNOSIS — Z1239 Encounter for other screening for malignant neoplasm of breast: Secondary | ICD-10-CM

## 2024-01-30 NOTE — Patient Instructions (Signed)
 Explained breast self awareness with Barbara Blevins. Patient did not need a Pap smear today due to last Pap smear and HPV typing was 10/31/2019. Let her know BCCCP will cover Pap smears and HPV typing every 5 years unless has a history of abnormal Pap smears. Referred patient to the [facility] for a diagnostic mammogram per recommendation. Appointment scheduled Tuesday, January 30, 2024 at 1340. Patient aware of appointment and will be there. Dufm Barbara Blevins verbalized understanding.  Lior Hoen, Wanda Ship, RN 12:37 PM
